# Patient Record
Sex: Female | Born: 1970 | Race: Black or African American | Hispanic: No | Marital: Married | Smoking: Current every day smoker
Health system: Southern US, Community
[De-identification: ages and names within clinical notes are randomized; demographics above are authoritative.]

---

## 1998-03-11 ENCOUNTER — Emergency Department (HOSPITAL_COMMUNITY): Admission: EM | Admit: 1998-03-11 | Discharge: 1998-03-11 | Payer: Self-pay | Admitting: Emergency Medicine

## 1998-03-21 ENCOUNTER — Other Ambulatory Visit: Admission: RE | Admit: 1998-03-21 | Discharge: 1998-03-21 | Payer: Self-pay | Admitting: Obstetrics & Gynecology

## 2000-01-10 ENCOUNTER — Emergency Department (HOSPITAL_COMMUNITY): Admission: EM | Admit: 2000-01-10 | Discharge: 2000-01-10 | Payer: Self-pay | Admitting: Emergency Medicine

## 2000-03-13 ENCOUNTER — Ambulatory Visit (HOSPITAL_COMMUNITY): Admission: RE | Admit: 2000-03-13 | Discharge: 2000-03-13 | Payer: Self-pay | Admitting: Gastroenterology

## 2002-04-23 ENCOUNTER — Other Ambulatory Visit: Admission: RE | Admit: 2002-04-23 | Discharge: 2002-04-23 | Payer: Self-pay | Admitting: Obstetrics and Gynecology

## 2004-08-08 ENCOUNTER — Other Ambulatory Visit: Admission: RE | Admit: 2004-08-08 | Discharge: 2004-08-08 | Payer: Self-pay | Admitting: Obstetrics and Gynecology

## 2005-12-28 ENCOUNTER — Inpatient Hospital Stay (HOSPITAL_COMMUNITY): Admission: AD | Admit: 2005-12-28 | Discharge: 2005-12-31 | Payer: Self-pay | Admitting: *Deleted

## 2005-12-28 ENCOUNTER — Encounter: Payer: Self-pay | Admitting: Emergency Medicine

## 2007-09-10 IMAGING — US US TRANSVAGINAL NON-OB
1 series · 13 of 25 positions shown · non-contrast
Comparison: CT on the same date.

CLINICAL DATA: 35-year-old with pelvic inflammatory disease.  Right adnexal mass, fever.  
TRANSABDOMINAL AND TRANSVAGINAL PELVIC ULTRASOUND:
TECHNIQUE: Both transabdominal and transvaginal ultrasound examinations of the pelvis were performed including evaluation of the uterus, ovaries, adnexal regions, and pelvic cul-de-sac.

[Series 1: us transvaginal non-ob · 0.27mm/px · 13 of 53 slices shown]
[im 1/53]
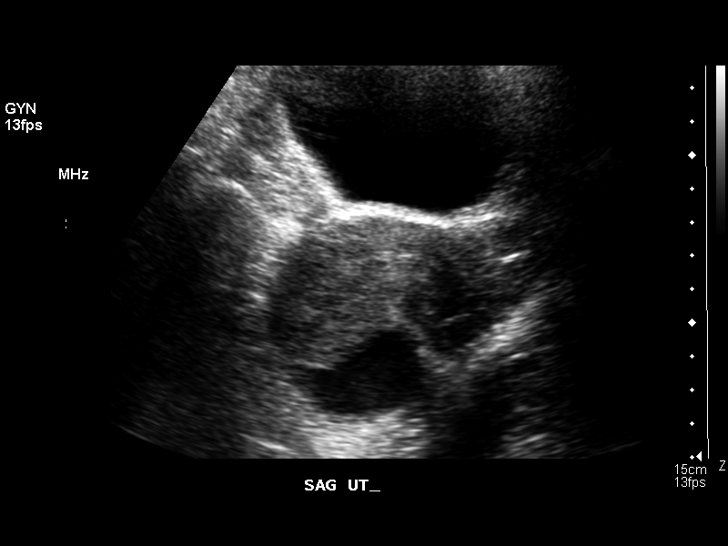
[im 5/53]
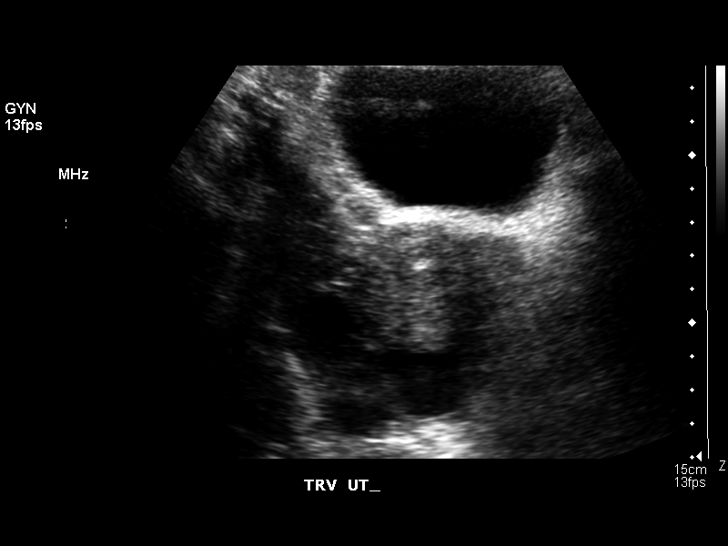
[im 9/53]
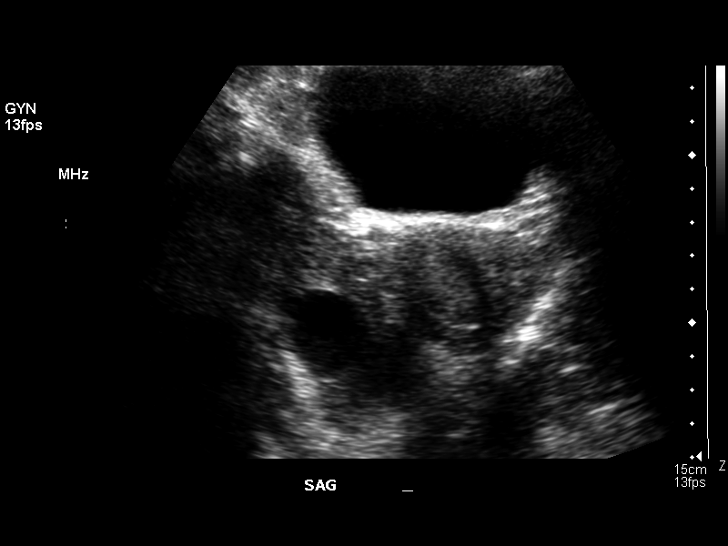
[im 14/53]
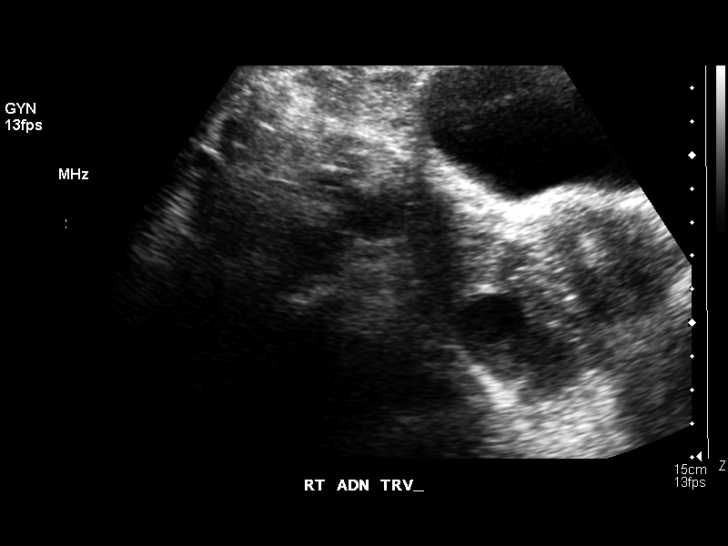
[im 18/53]
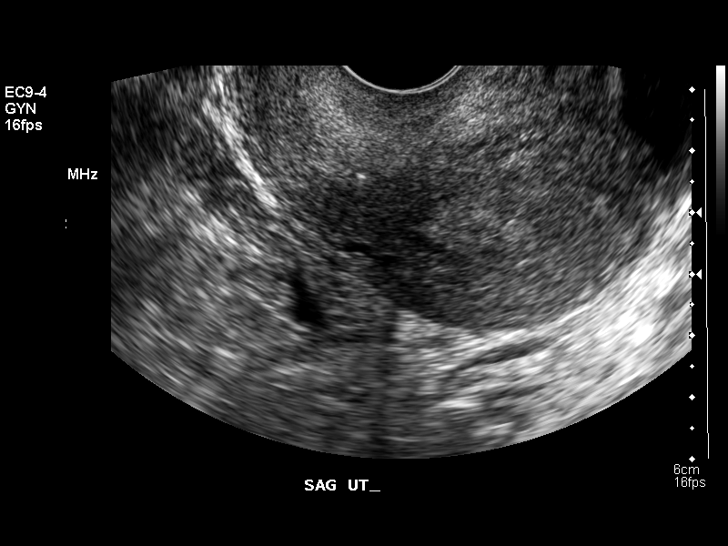
[im 22/53]
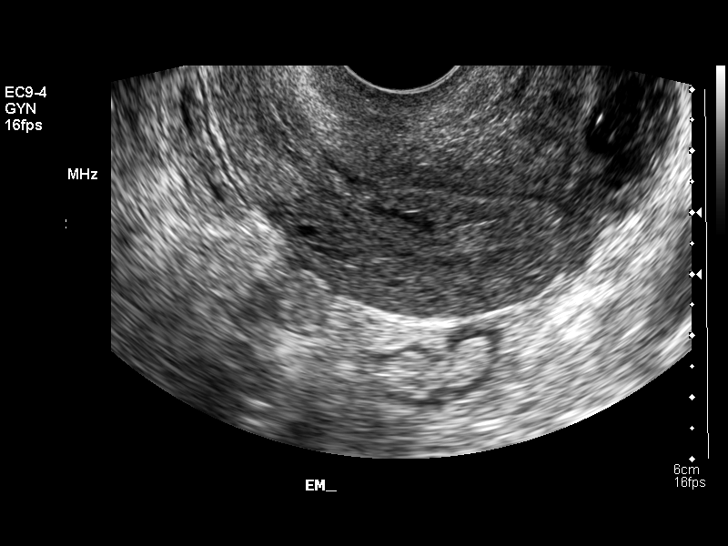
[im 27/53]
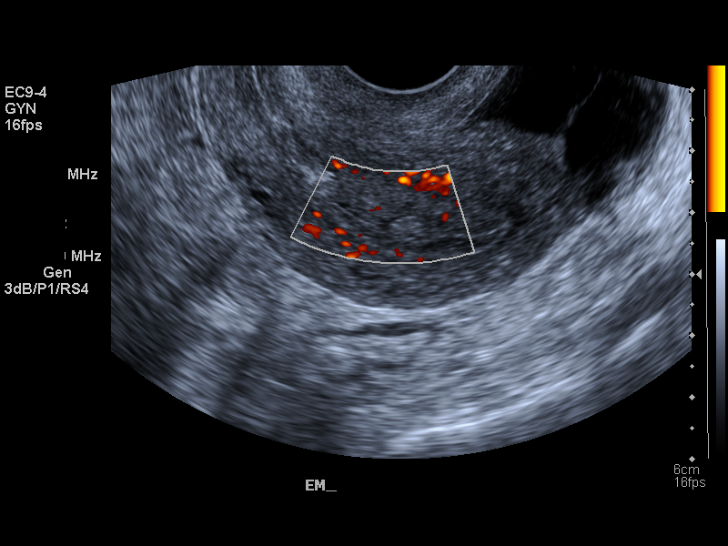
[im 31/53]
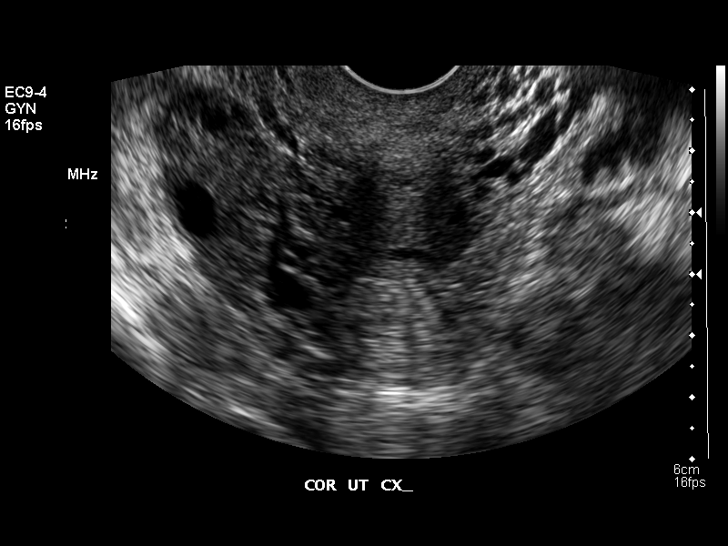
[im 35/53]
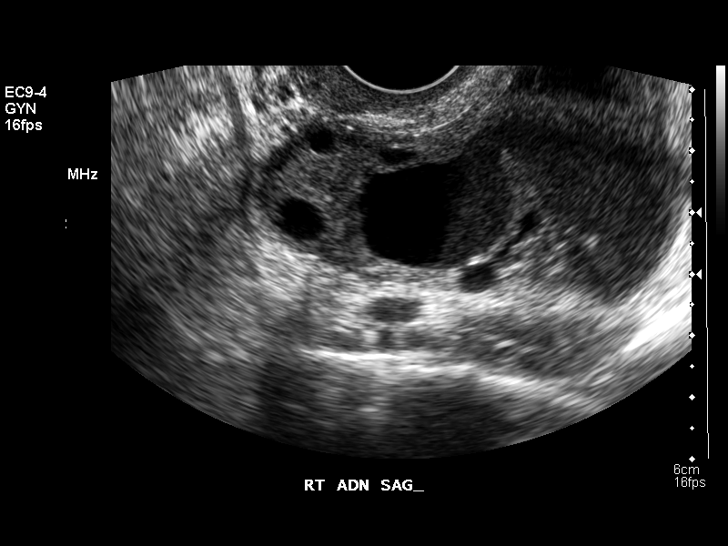
[im 40/53]
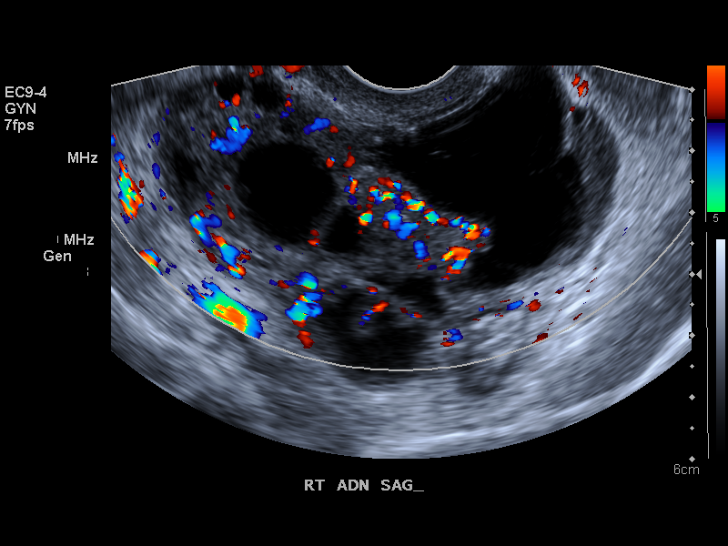
[im 44/53]
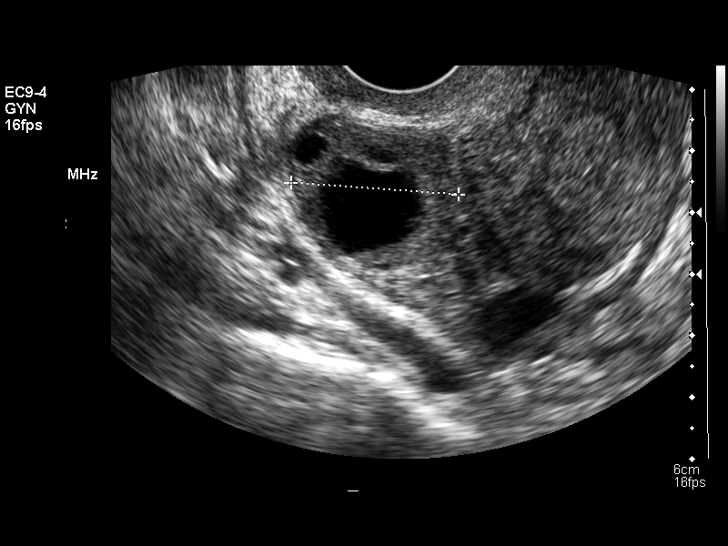
[im 48/53]
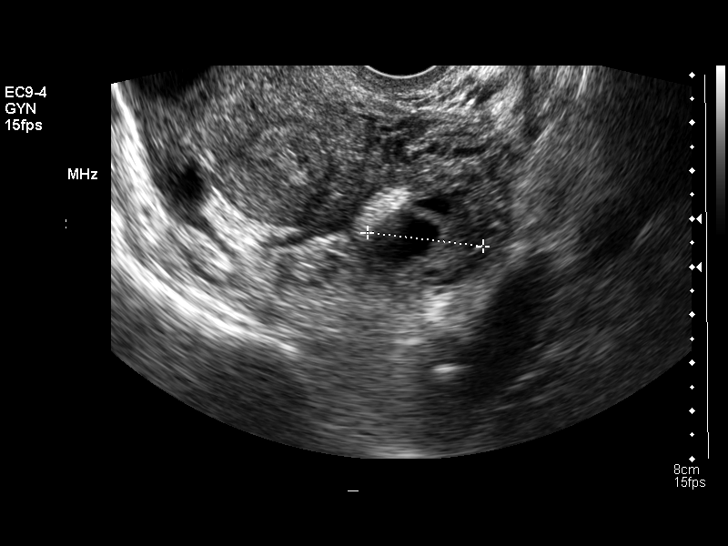
[im 53/53]
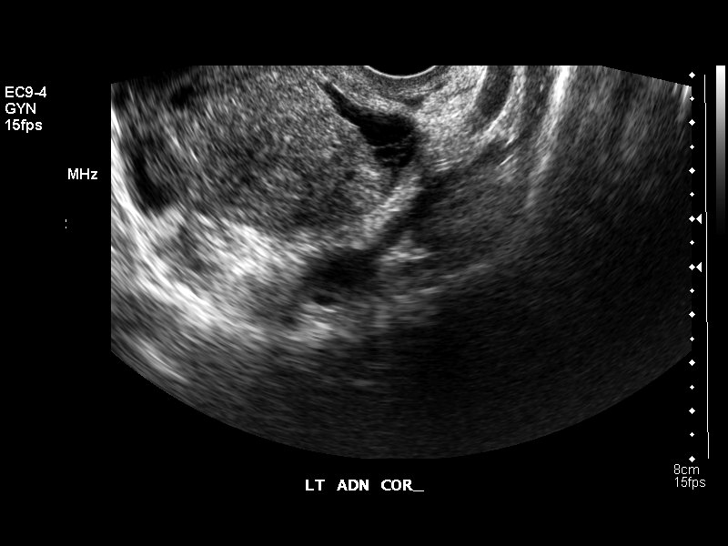

[13 of 25 positions shown; findings below may reference images not displayed]

FINDINGS: Uterus is 7.3 x 3.6 x 4.1 cm.  The endometrium is thickened and nodular, measuring up to 11.4 mm.  Small echogenic areas within the endometrium raise the question of discrete polyps measuring approximately 7 and 5 mm in diameter.  
The right ovary is 4.2 x 2.6  x 2.7 cm.  Within the right adnexa, there is a complex tubular structure containing echogenic fluid.  This is consistent with pyosalpinx in the setting of pelvic inflammatory disease.  Overall, this abnormality measures 6.9 x 3.4 x 7.2 cm.  Adjacent to this tubular structure, there is a fluid collection which contains internal septations.  This could represent complex free pelvic fluid but an endometrioma could have a similar appearance.  The left ovary is 2.6 x 1.9 x 2.4 cm and has a normal appearance.
IMPRESSION: 1.  Complex, tubular structure in the right adnexa consistent with tuboovarian abscess/pyosalpinx.  
2.  Complex, septated fluid collection in the right adnexa could represent infected free fluid or endometrioma.  
3.  Suspect endometrial polyps.  Consider further evaluation with sonohysterogram or hysteroscopy.

## 2009-12-09 ENCOUNTER — Encounter: Admission: RE | Admit: 2009-12-09 | Discharge: 2009-12-09 | Payer: Self-pay | Admitting: Emergency Medicine

## 2009-12-11 ENCOUNTER — Inpatient Hospital Stay (HOSPITAL_COMMUNITY): Admission: AD | Admit: 2009-12-11 | Discharge: 2009-12-11 | Payer: Self-pay | Admitting: Obstetrics and Gynecology

## 2010-08-05 LAB — GC/CHLAMYDIA PROBE AMP, GENITAL
Chlamydia, DNA Probe: NEGATIVE
GC Probe Amp, Genital: POSITIVE — AB

## 2010-08-05 LAB — URINE CULTURE: Colony Count: NO GROWTH

## 2010-10-06 NOTE — Discharge Summary (Signed)
Valerie Mack, Valerie Mack              ACCOUNT NO.:  [id number]   MEDICAL RECORD NO.:  [id number]          PATIENT TYPE:  INP   LOCATION:  9311                          FACILITY:  [facility]   PHYSICIAN:  Richardean Sale, M.D.   DATE OF BIRTH:  07-11-1970   DATE OF ADMISSION:  12/28/2005  DATE OF DISCHARGE:  12/31/2005                                 DISCHARGE SUMMARY   ADMISSION DIAGNOSES:  Abdominal pain, pain, fever. Suspected pelvic  inflammatory disease.   DISCHARGE DIAGNOSIS:  Pelvic inflammatory disease.   HOSPITAL COURSE/HISTORY OF PRESENT ILLNESS:  Please see admission history  and physical for details. Briefly, this is a 40 year old African-American  female who presented to [facility] ER on December 28, 2005, complaining of  abdominal pain. The patient underwent evaluation and was found to have  complex fluid collection in the right lower quadrant and adnexa with a  normal appendix. The patient had a fever of 102. She was subsequently  transferred to [facility] for admission with a diagnosis of PID. The  patient was started on doxycycline and cefoxitin. She was treated with anti-  emetics and pain medicine as needed and on hospital day #4 she was  discharged home in improved condition.   DISPOSITION:  To home.   CONDITION ON DISCHARGE:  Improved.   FOLLOWUP:  The patient will follow up in the office in 2 weeks.   MEDICATIONS:  1. Doxycycline 100 mg p.o., b.i.d. for 14 days.  2. Flagyl 500 mg p.o., b.i.d. for 14 days.  3. Ibuprofen 800 mg p.o., q.8 hours as needed.  4. Darvocet 1 tab p.o. q.4-6 hours as needed for pain.   DISCHARGE INSTRUCTIONS:  The patient is to call for fever greater than 101,  increase in abdominal pain. She was also instructed to have her partner  treated as she did test positive for trichomonas while she was here.   LABORATORY STUDIES:  Wet prep positive for trichomonas. GC/chlamydia screens  negative. White count 14.6, hemoglobin 12.8, hematocrit  38.2, platelets 168.  CT scan showed a fluid collection in the right lower quadrant. Ultrasound  was performed which revealed a complex tubular structure in the right adnexa  consistent with a TOA.      Richardean Sale, M.D.  Electronically Signed     JW/MEDQ  D:  02/16/2006  T:  02/18/2006  Job:  [id number]

## 2010-10-06 NOTE — H&P (Signed)
Valerie Mack, Valerie Mack              ACCOUNT NO.:  [id number]   MEDICAL RECORD NO.:  [id number]          PATIENT TYPE:  INP   LOCATION:  9311                          FACILITY:  [facility]   PHYSICIAN:  Richardean Sale, M.D.   DATE OF BIRTH:  26-Sep-1970   DATE OF ADMISSION:  12/28/2005  DATE OF DISCHARGE:                                HISTORY & PHYSICAL   ADMISSION DIAGNOSIS:  Abdominal pain, suspect pelvic inflammatory disease.   HISTORY OF PRESENT ILLNESS:  This is a day 40 year old, gravida 0, African-  American female who has had a 2-day history of generalized abdominal pain  who presented to the [facility] ER at approximately 12 a.m. on August 10,  2007complaining of severe abdominal pain.  The patient had an episode of  emesis in the ER and was found to be febrile with a fever up to 102. She  underwent CT examination  of the abdomen and pelvis which revealed a normal  terminal ileum and appendix but a fluid collection in the right lower  quadrant and adnexa suggestive of PID/TOA.  The patient denies any sexual  activity for the last 6 months.  She is not using contraception, her menses  have been regular.  Her last menstrual period was last week.  She did have  some diarrhea yesterday but none today and had a normal bowel movement  earlier today. She has no known history of sexually transmitted infections.  She was transferred today to [facility] for evaluation by gynecology.   PAST MEDICAL HISTORY:  No prior hospitalizations.   PAST SURGICAL HISTORY:  Laparoscopy for endometriosis   OBSTETRICAL HISTORY:  Gravida 0.   GYNECOLOGIC HISTORY:  Menses are regular occurring every 28-35 days lasting  5 days on average not currently sexually active. No history of gonorrhea,  chlamydia, herpes or any other STDs.  Positive history of abnormal Pap smear  but no colposcopy or surgery needed.   FAMILY HISTORY:  Noncontributory.   SOCIAL HISTORY:  Positive tobacco use.  No drug use or  alcohol use.   ALLERGIES:  No known drug allergies.   MEDICATIONS:  None.   REVIEW OF SYSTEMS:  Positive for a emesis, diarrhea, abdominal pain, fever,  increased vaginal discharge.  No chest pain, shortness of breath, dysuria,  hematuria.  All other systems negative.   PHYSICAL EXAM:  VITAL SIGNS:  Tmax 102, blood pressure 110/71, pulse 100,  respirations 18, pulse ox 97% on room air.  GENERAL:  She is a well-developed, well-nourished African- American female  who appears in no acute distress.  HEART:  Regular rate and rhythm.  LUNGS:  Clear to auscultation bilaterally.  ABDOMEN:  Soft.  Generalized tenderness is present.  No rebound.  Positive  voluntary guarding particularly in the right lower quadrant.  No palpable  masses.  EXTREMITIES:  No cyanosis, clubbing or edema.  PELVIC:  Normal external genitalia.  The vagina is pink, moist and rugated.  There is thick white discharge present in the vault, a wet prep was taken.  The cervix appears normal with no abnormal cervical discharge.  Gonorrhea  and chlamydia swab obtained.  Bimanual exam moderate amount of cervical  motion tenderness.  No chandelier sign. Fullness in the right adnexa.  Generalized tenderness throughout exam.   LABORATORY STUDIES:  White count 14.4 with 91% neutrophils, hemoglobin 14.5,  hematocrit 42.1, platelets 180. Complete metabolic panel is normal with the  exception of glucose 107, alkaline phosphatase 34, serum lipase 15.  Urine  pregnancy test negative.  Urinalysis positive for small amount of ketones,  blood and a large amount of leukocytes, negative nitrites.  Urine microscopy  shows 11-12 white blood cells and 3-6 red cells per high powered field. CAT  scan of the abdomen and pelvis negative.  CT of the abdomen and pelvis,  there is low attenuation structures in the right adnexa which may represent  ovarian cyst, endometriomas, abscess or possibly right hydrosalpinx and  there is a moderate amount  of free fluid in the pelvis. Appendix and  terminal ileum appear normal.   ASSESSMENT:  A 41 year old, gravida 0, black female with abdominal pain,  fever and complex right adnexal structure. Clinical picture consistent with  pelvic inflammatory disease.   PLAN:  1. Will admit to the women's Unit.  2. Will start doxycycline 100 mg IV q.12 h and cefoxitin 2 grams IV q.6 h      until the patient defervesces.  3. Will check pelvic ultrasound to evaluate for possible TOA.  4. Analgesics and antiemetics as needed.  5. Will plan for continued hospital stay until the patient is afebrile for      48 hours and her pain is improved. Discussed with the patient the      possibility of requiring surgery if she clinically does not improve.      Richardean Sale, M.D.  Electronically Signed     JW/MEDQ  D:  12/28/2005  T:  12/28/2005  Job:  [id number]

## 2011-07-24 ENCOUNTER — Ambulatory Visit (INDEPENDENT_AMBULATORY_CARE_PROVIDER_SITE_OTHER): Payer: 59 | Admitting: Obstetrics and Gynecology

## 2011-07-24 DIAGNOSIS — Z01419 Encounter for gynecological examination (general) (routine) without abnormal findings: Secondary | ICD-10-CM

## 2011-07-24 DIAGNOSIS — Z202 Contact with and (suspected) exposure to infections with a predominantly sexual mode of transmission: Secondary | ICD-10-CM

## 2011-07-25 ENCOUNTER — Other Ambulatory Visit: Payer: Self-pay | Admitting: Obstetrics and Gynecology

## 2011-07-25 DIAGNOSIS — Z1231 Encounter for screening mammogram for malignant neoplasm of breast: Secondary | ICD-10-CM

## 2011-08-02 ENCOUNTER — Ambulatory Visit
Admission: RE | Admit: 2011-08-02 | Discharge: 2011-08-02 | Disposition: A | Discharge status: Home / Self Care | Payer: Self-pay | Source: Ambulatory Visit | Attending: Obstetrics and Gynecology | Admitting: Obstetrics and Gynecology

## 2011-08-02 DIAGNOSIS — Z1231 Encounter for screening mammogram for malignant neoplasm of breast: Secondary | ICD-10-CM

## 2011-08-07 ENCOUNTER — Other Ambulatory Visit: Payer: Self-pay | Admitting: Obstetrics and Gynecology

## 2011-08-07 DIAGNOSIS — R928 Other abnormal and inconclusive findings on diagnostic imaging of breast: Secondary | ICD-10-CM

## 2011-08-13 ENCOUNTER — Ambulatory Visit
Admission: RE | Admit: 2011-08-13 | Discharge: 2011-08-13 | Disposition: A | Discharge status: Home / Self Care | Payer: 59 | Source: Ambulatory Visit | Attending: Obstetrics and Gynecology | Admitting: Obstetrics and Gynecology

## 2011-08-13 DIAGNOSIS — R928 Other abnormal and inconclusive findings on diagnostic imaging of breast: Secondary | ICD-10-CM

## 2011-08-22 IMAGING — CT CT ABD-PELV W/ CM
2 of 4 series · 13 of 32 positions shown, 18 images · IV contrast (READICAT/WATER & [ID] OMNI 300)
Comparison: 12/28/2005

CLINICAL DATA: Abdominal pain.

CT ABDOMEN AND PELVIS WITH CONTRAST
TECHNIQUE: Multidetector CT imaging of the abdomen and pelvis was
performed following the standard protocol during bolus
administration of intravenous contrast.
Contrast: 100 ml Omnipaque 300 IV.

[Series 2: abdomen w/ · axial · 0.66mm/px · z∈[-374,-99]mm · 5 of 83 slices shown, 10 images]
[im 14/83  soft-tissue]
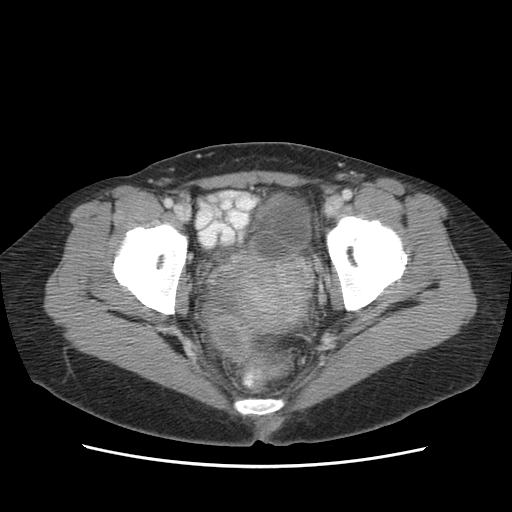
[im 14/83  bone]
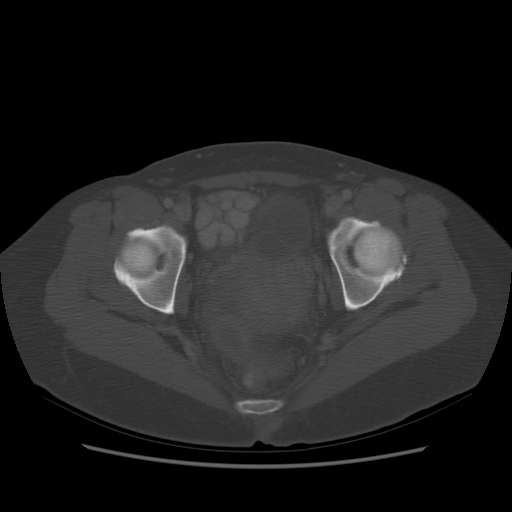
[im 28/83  soft-tissue]
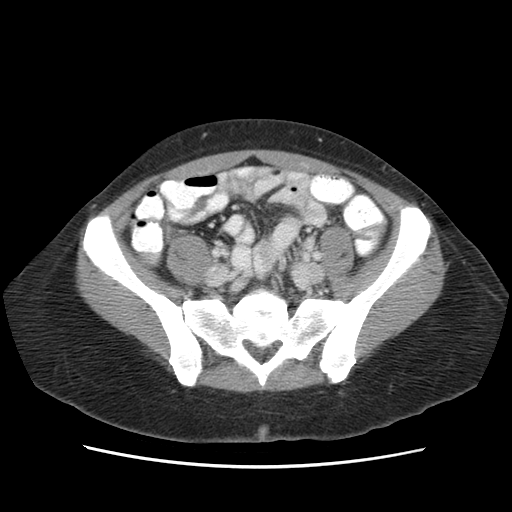
[im 28/83  lung]
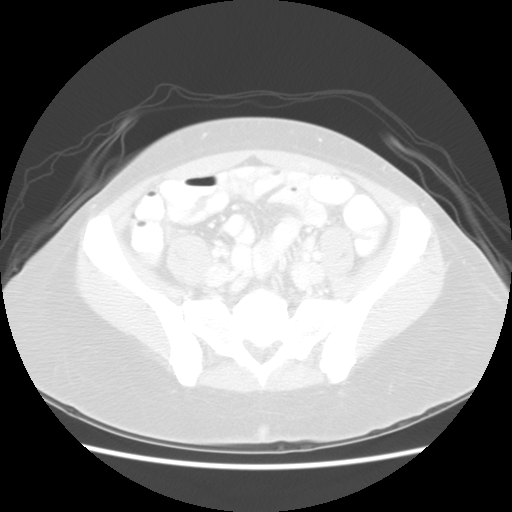
[im 42/83  soft-tissue]
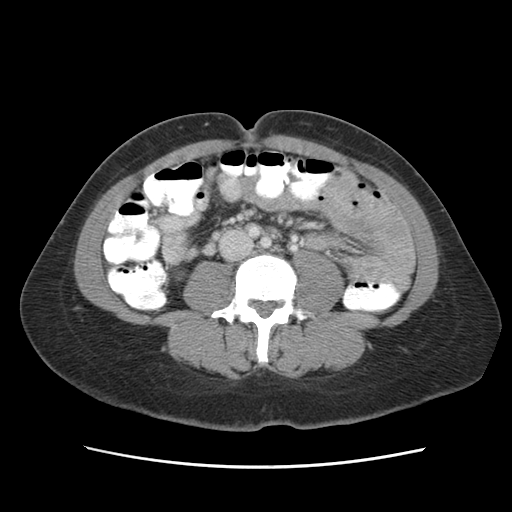
[im 42/83  lung]
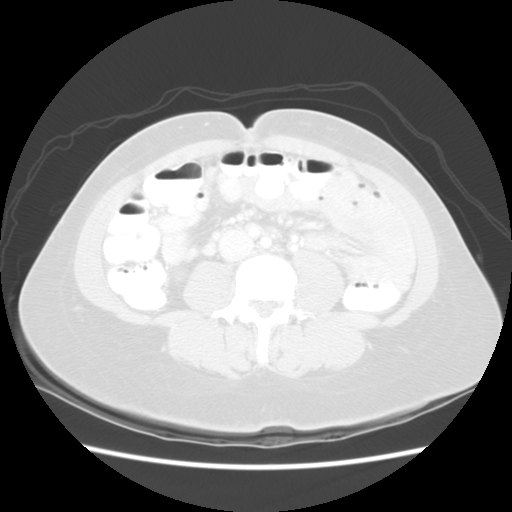
[im 55/83  soft-tissue]
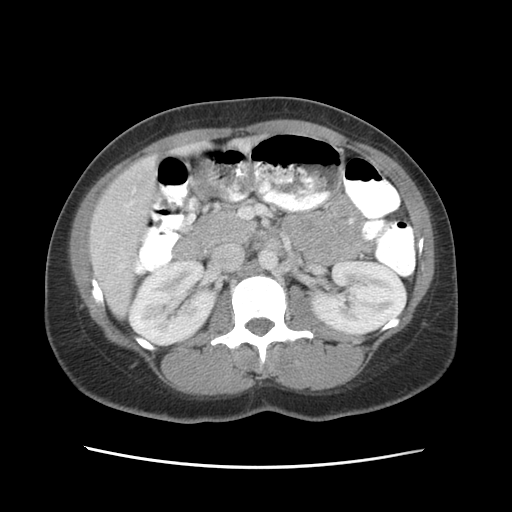
[im 55/83  lung]
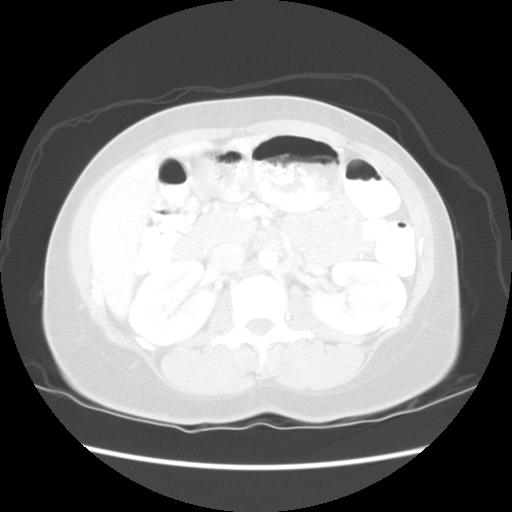
[im 69/83  soft-tissue]
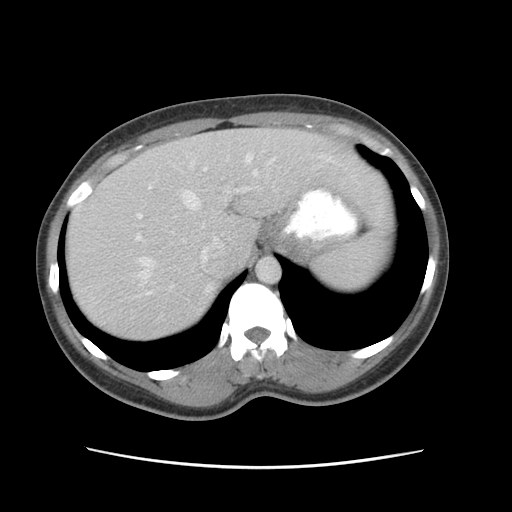
[im 69/83  lung]
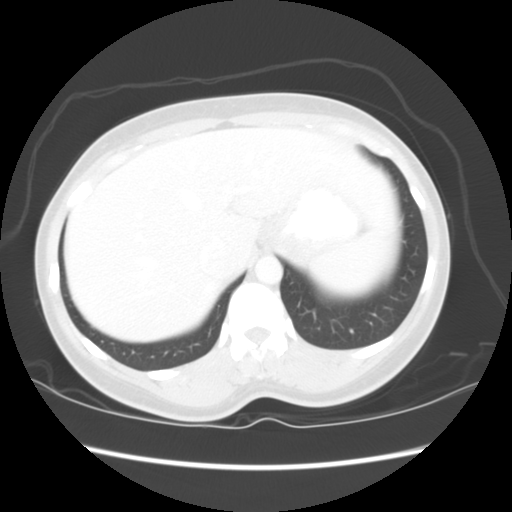

[Series 400: sagittal · sagittal · 0.84mm/px · 8 of 127 slices shown]
[im 11/127  soft-tissue]
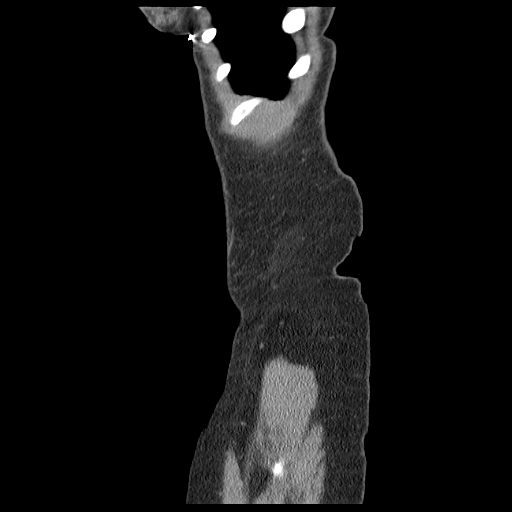
[im 32/127  soft-tissue]
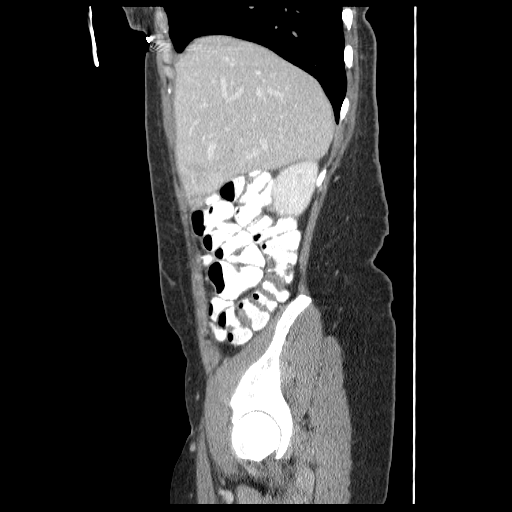
[im 43/127  soft-tissue]
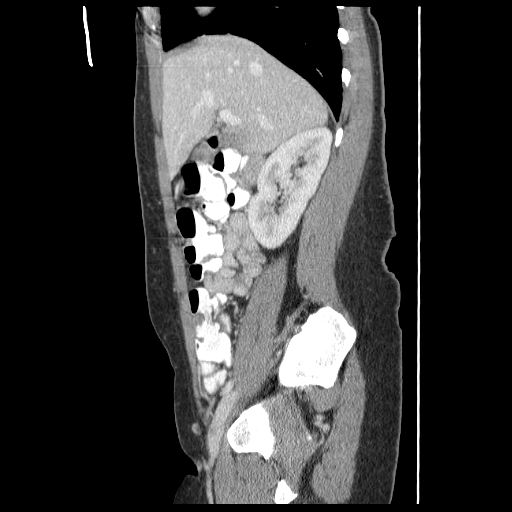
[im 53/127  soft-tissue]
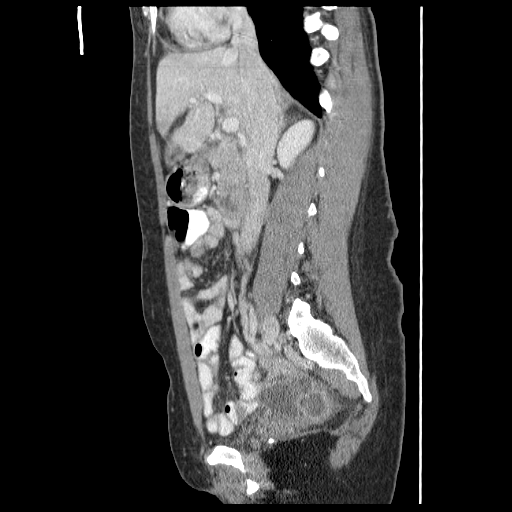
[im 74/127  soft-tissue]
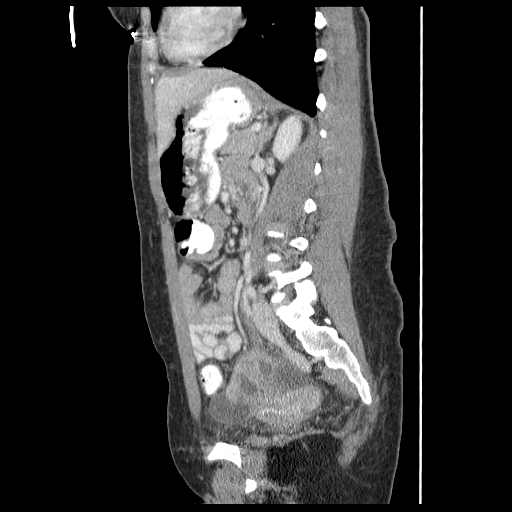
[im 85/127  soft-tissue]
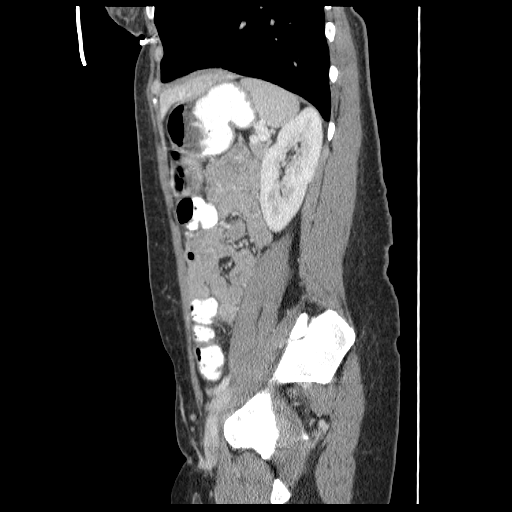
[im 95/127  soft-tissue]
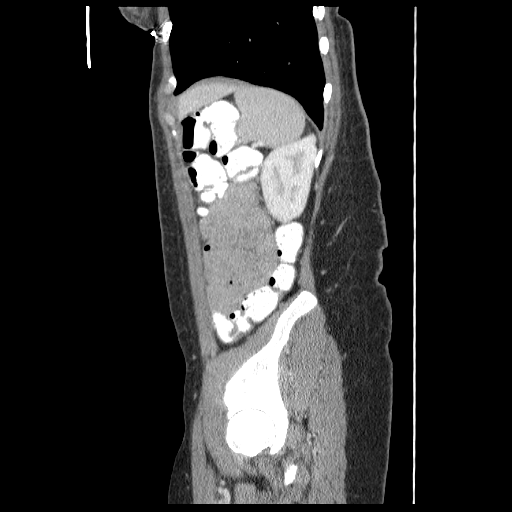
[im 116/127  soft-tissue]
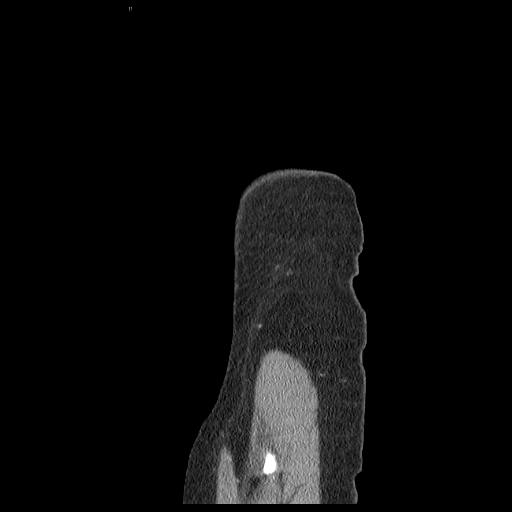

[13 of 32 positions shown; findings below may reference images not displayed]

FINDINGS: Lung bases are clear.  No effusions.  Heart is normal
size.

Liver, spleen, pancreas, adrenals, gallbladder and kidneys
unremarkable.  Gallbladder is decompressed.

Aorta is normal caliber.  There are small retroperitoneal lymph
nodes, none pathologically enlarged.  Appendix is visualized and is
normal.  Large and small bowel grossly unremarkable.

There are large complex cystic areas within both adnexal regions.
These have a somewhat tubular appearance.  This could represent
hydrosalpinx or pyosalpinx.  Pelvic inflammatory disease and tubal
ovarian abscesses is also possible.  There is a small amount of
free fluid.  Uterus is unremarkable.
IMPRESSION: Complex tubular appearing cystic masses in the adnexal regions
bilaterally.  Differential considerations would include
hydrosalpinx, pyosalpinx, tubo-ovarian abscesses.

Small amount of free fluid in the pelvis.

Normal appendix.

## 2012-07-25 ENCOUNTER — Ambulatory Visit: Payer: 59 | Admitting: Obstetrics and Gynecology

## 2019-01-14 ENCOUNTER — Other Ambulatory Visit: Payer: Self-pay

## 2019-01-14 DIAGNOSIS — Z20822 Contact with and (suspected) exposure to covid-19: Secondary | ICD-10-CM

## 2019-01-15 LAB — NOVEL CORONAVIRUS, NAA: SARS-CoV-2, NAA: NOT DETECTED

## 2019-01-16 ENCOUNTER — Telehealth: Payer: Self-pay | Admitting: General Practice

## 2019-01-16 NOTE — Telephone Encounter (Signed)
Patient informed of negative covid result. Patient verbalized understanding.   

## 2019-05-14 ENCOUNTER — Other Ambulatory Visit: Payer: Self-pay

## 2019-07-31 ENCOUNTER — Ambulatory Visit: Payer: 59 | Attending: Internal Medicine

## 2023-08-01 ENCOUNTER — Other Ambulatory Visit: Payer: Self-pay

## 2023-08-01 ENCOUNTER — Ambulatory Visit
Admission: RE | Admit: 2023-08-01 | Discharge: 2023-08-01 | Disposition: A | Discharge status: Home / Self Care | Source: Ambulatory Visit | Attending: Physician Assistant | Admitting: Physician Assistant

## 2023-08-01 VITALS — BP 138/97 | HR 64 | Temp 98.2°F | Resp 18 | Ht 62.0 in | Wt 134.0 lb

## 2023-08-01 DIAGNOSIS — R051 Acute cough: Secondary | ICD-10-CM

## 2023-08-01 DIAGNOSIS — J9801 Acute bronchospasm: Secondary | ICD-10-CM | POA: Diagnosis not present

## 2023-08-01 DIAGNOSIS — M542 Cervicalgia: Secondary | ICD-10-CM | POA: Diagnosis not present

## 2023-08-01 DIAGNOSIS — M25511 Pain in right shoulder: Secondary | ICD-10-CM

## 2023-08-01 MED ORDER — CYCLOBENZAPRINE HCL 10 MG PO TABS: 10.0000 mg | ORAL_TABLET | Freq: Two times a day (BID) | ORAL | 0 refills | Status: AC | PRN

## 2023-08-01 MED ORDER — PREDNISONE 20 MG PO TABS: ORAL_TABLET | ORAL | 0 refills | Status: AC

## 2023-08-01 NOTE — ED Provider Notes (Signed)
 [facility]    CSN: [id number] Arrival date & time: 08/01/23  0848      History   Chief Complaint Chief Complaint  Patient presents with   Tingling    HPI Valerie Mack is a 53 y.o. female.   HPI  Patient presents today with concerns for right arm.  She reports that she has had tingling that starts at the shoulder blade and radiates down to the fingers for the past 3 weeks. She states it initially started as a tight knot in her shoulder blade then progressed to include the rest of her arm She states she started to develop tingling that radiates to her fingers and extending her elbow sometimes hurts  Pain level and character: 7/10 Interventions: Aspirin, Biofreeze, Voltaren, heating pad  Alleviating: Biofreeze seems to help a bit but does not last  Aggravating:  She reports symptoms are worst at night when she is laying down. She reports when she wakes up in the AM the tingling is not as bad but is still there     She denies trauma, repetitive motion injury, falls, rashes   Patient also reports development of a productive cough and concern for chest congestion for the past  3 weeks. Interventions: Delsym, Robitussin, cough drops, mint tea  Recent sick contacts : she has had  few sick contacts recently    History reviewed. No pertinent past medical history.  There are no active problems to display for this patient.   History reviewed. No pertinent surgical history.  OB History   No obstetric history on file.      Home Medications    Prior to Admission medications   Medication Sig Start Date End Date Taking? Authorizing Provider  cyclobenzaprine (FLEXERIL) 10 MG tablet Take 1 tablet (10 mg total) by mouth 2 (two) times daily as needed for muscle spasms. 08/01/23  Yes Sruti Ayllon E, PA-C  predniSONE (DELTASONE) 20 MG tablet Take 60mg  PO daily x 2 days, then40mg  PO daily x 2 days, then 20mg  PO daily x 3 days 08/01/23  Yes Cherlyn Syring, Oswaldo Conroy, PA-C     Family History History reviewed. No pertinent family history.  Social History Social History   Tobacco Use   Smoking status: Every Day    Types: Cigarettes   Smokeless tobacco: Never  Vaping Use   Vaping status: Never Used  Substance Use Topics   Alcohol use: Yes    Comment: occasional   Drug use: Never     Allergies   Patient has no known allergies.   Review of Systems Review of Systems  Constitutional:  Negative for chills and fever.  Respiratory:  Positive for cough. Negative for chest tightness, shortness of breath and wheezing.   Musculoskeletal:  Positive for back pain, myalgias and neck pain.  Neurological:  Positive for numbness. Negative for dizziness and headaches.     Physical Exam Triage Vital Signs ED Triage Vitals  Encounter Vitals Group     BP 08/01/23 0916 (!) 138/97     Systolic BP Percentile --      Diastolic BP Percentile --      Pulse Rate 08/01/23 0916 64     Resp 08/01/23 0916 18     Temp 08/01/23 0916 98.2 F (36.8 C)     Temp Source 08/01/23 0916 Oral     SpO2 08/01/23 0916 96 %     Weight 08/01/23 0914 134 lb (60.8 kg)     Height 08/01/23 0914 5'  2" (1.575 m)     Head Circumference --      Peak Flow --      Pain Score 08/01/23 0913 7     Pain Loc --      Pain Education --      Exclude from Growth Chart --    No data found.  Updated Vital Signs BP (!) 138/97 (BP Location: Right Arm)   Pulse 64   Temp 98.2 F (36.8 C) (Oral)   Resp 18   Ht 5\' 2"  (1.575 m)   Wt 134 lb (60.8 kg)   SpO2 96%   BMI 24.51 kg/m   Visual Acuity Right Eye Distance:   Left Eye Distance:   Bilateral Distance:    Right Eye Near:   Left Eye Near:    Bilateral Near:     Physical Exam Pulmonary:     Effort: Pulmonary effort is normal.     Breath sounds: Normal breath sounds. No decreased air movement. No decreased breath sounds, wheezing, rhonchi or rales.  Musculoskeletal:     Comments: No obvious swelling, rashes to affected area along  the back.  No obvious step-offs or deformities palpated along spine. ROM findings Neck: ROM is intact with regards to flexion, extension, lateral rotation, lateral flexion.  She does report discomfort with flexion, lateral rotation and lateral flexion to the right. Shoulder: ROM is intact with regards to flexion, extension, abduction, adduction, internal and external rotation.  She reports discomfort with internal and external rotation, forward flexion  Wrist: Wrist ROM is intact with regards to flexion extension, lateral flexion.  Finger ROM is normal.  Thumb ROM is normal.      UC Treatments / Results  Labs (all labs ordered are listed, but only abnormal results are displayed) Labs Reviewed - No data to display   EKG   Radiology No results found.  Procedures Procedures (including critical care time)  Medications Ordered in UC Medications - No data to display  Initial Impression / Assessment and Plan / UC Course  I have reviewed the triage vital signs and the nursing notes.  Pertinent labs & imaging results that were available during my care of the patient were reviewed by me and considered in my medical decision making (see chart for details).      Final Clinical Impressions(s) / UC Diagnoses   Final diagnoses:  Acute pain of right shoulder  Neck pain on right side  Acute cough  Cough due to bronchospasm   Right shoulder and arm pain Patient presents today with concerns for right-sided neck discomfort as well as arm tingling.  Physical exam is overall reassuring with intact range of motion along neck, shoulder, wrist and thoracic spine.  At this time I suspect likely muscular strain and will treat with conservative measures for now.  Recommend over-the-counter medications for pain relief, warm compresses, gentle stretching and massage as tolerated.  Will provide prednisone taper to assist with inflammation.  Will also provide Flexeril to assist with nighttime pain and  sleep.  Side effects of both were reviewed and discussed with patient.  Recommend follow-up with primary care provider if symptoms are persisting.  Reviewed that she can go to orthopedics for further evaluation and potential referral to physical therapy should this be indicated by persistent or progressing symptoms.  Acute cough Patient also reports concerns for ongoing dry cough and coughing fits.  Physical exam demonstrates normal lung auscultation without signs of decreased air movement.  Vitals are overall reassuring  today.  At this time I suspect likely bronchospasm is causing cough, maybe even bronchitis Reviewed that the prednisone will also provide inflammation reduction and lungs and assist with coughing.  Recommend continued use of over-the-counter medications as needed for symptom relief.  ED and return precautions reviewed and provided in after visit summary.  Follow-up as needed    Discharge Instructions      Based on your symptoms I suspect that you likely have inflammation and a potential strain to some of the large muscles in your back that is putting some pressure on the nerves that radiate into your right arm. I recommend the following at this time to help relieve that discomfort:  Rest Warm compresses to the area (20 minutes on, minimum of 30 minutes off) You can alternate Tylenol and Ibuprofen for pain management but Ibuprofen is typically preferred to reduce inflammation.  I have sent in a script for Prednisone taper to be taken in the morning with breakfast per the instructions on the container Remember that steroids can cause sleeplessness, irritability, increased hunger and elevated glucose levels so be mindful of these side effects. They should lessen as you progress to the lower doses of the taper. While you are taking the prednisone I do not recommend taking NSAIDs for pain management.  You can take NSAIDs such as ibuprofen, Aleve after finishing the prednisone taper.   You can take Tylenol as well as the muscle relaxer that I have sent in.  Please note that the muscle relaxer can cause drowsiness and sedation so please do not take this if you need to remain alert or drive.  Gentle stretches and exercises that I have included in your paperwork Try to reduce excess strain to the area and rest as much as possible  Wear supportive shoes and, if you must lift anything, use proper lifting techniques that spare your back.   If your symptoms are not improving over the next 2 to 4 weeks or seem like they are getting worse I recommend following up with orthopedics for further management and potential referral to physical therapy. [facility] [address], [place], [place] [address] [phone]  The prednisone taper should also assist with your coughing.  Steroids help reduce inflammation which I suspect is likely causing your persistent cough. If you continue to have a cough, the cough becomes productive, you start to develop fevers that are not responding to medication use, you start having chest pain or trouble breathing please return here or to the emergency room for further evaluation and management      ED Prescriptions     Medication Sig Dispense Auth. Provider   predniSONE (DELTASONE) 20 MG tablet Take 60mg  PO daily x 2 days, then40mg  PO daily x 2 days, then 20mg  PO daily x 3 days 13 tablet Anaiyah Anglemyer E, PA-C   cyclobenzaprine (FLEXERIL) 10 MG tablet Take 1 tablet (10 mg total) by mouth 2 (two) times daily as needed for muscle spasms. 20 tablet Devaris Quirk E, PA-C      PDMP not reviewed this encounter.   Providence Crosby, PA-C 08/01/23 1251

## 2023-08-01 NOTE — ED Triage Notes (Signed)
 Pt presents with complaints of right arm tingling, starts at the shoulder blade and radiates down to the fingers x 3 weeks. Pt currently rates her overall pain a 7/10. OTC Aspirin, Biofreeze, and Voltaren taken. Heating pads also applied to area with no relief.   Pt also voices she has a productive cough. Having chest congestion as well.

## 2023-08-01 NOTE — Discharge Instructions (Addendum)
 Based on your symptoms I suspect that you likely have inflammation and a potential strain to some of the large muscles in your back that is putting some pressure on the nerves that radiate into your right arm. I recommend the following at this time to help relieve that discomfort:  Rest Warm compresses to the area (20 minutes on, minimum of 30 minutes off) You can alternate Tylenol and Ibuprofen for pain management but Ibuprofen is typically preferred to reduce inflammation.  I have sent in a script for Prednisone taper to be taken in the morning with breakfast per the instructions on the container Remember that steroids can cause sleeplessness, irritability, increased hunger and elevated glucose levels so be mindful of these side effects. They should lessen as you progress to the lower doses of the taper. While you are taking the prednisone I do not recommend taking NSAIDs for pain management.  You can take NSAIDs such as ibuprofen, Aleve after finishing the prednisone taper.  You can take Tylenol as well as the muscle relaxer that I have sent in.  Please note that the muscle relaxer can cause drowsiness and sedation so please do not take this if you need to remain alert or drive.  Gentle stretches and exercises that I have included in your paperwork Try to reduce excess strain to the area and rest as much as possible  Wear supportive shoes and, if you must lift anything, use proper lifting techniques that spare your back.   If your symptoms are not improving over the next 2 to 4 weeks or seem like they are getting worse I recommend following up with orthopedics for further management and potential referral to physical therapy. [facility] [address], [address], [place], [place] [address] [phone]  The prednisone taper should also assist with your coughing.  Steroids help reduce inflammation which I suspect is likely causing your persistent cough. If you continue to have a cough,  the cough becomes productive, you start to develop fevers that are not responding to medication use, you start having chest pain or trouble breathing please return here or to the emergency room for further evaluation and management

## 2023-08-16 ENCOUNTER — Telehealth: Payer: Self-pay

## 2023-08-16 NOTE — Telephone Encounter (Signed)
 Pt called requesting MRI referral and wanting to know if UC could give cortisone injections. Advised UC did not offer these interventions. Provided with [facility]'s information. Pt expressed appreciation and verbalized understanding.
